# Patient Record
Sex: Male | Born: 1960 | Race: Black or African American | Hispanic: No | Marital: Married | Smoking: Former smoker
Health system: Southern US, Community
[De-identification: ages and names within clinical notes are randomized; demographics above are authoritative.]

## PROBLEM LIST (undated history)

## (undated) DIAGNOSIS — R42 Dizziness and giddiness: Secondary | ICD-10-CM

## (undated) DIAGNOSIS — E785 Hyperlipidemia, unspecified: Secondary | ICD-10-CM

## (undated) DIAGNOSIS — R079 Chest pain, unspecified: Secondary | ICD-10-CM

## (undated) HISTORY — DX: Dizziness and giddiness: R42

## (undated) HISTORY — DX: Chest pain, unspecified: R07.9

## (undated) HISTORY — DX: Hyperlipidemia, unspecified: E78.5

---

## 2001-06-11 ENCOUNTER — Ambulatory Visit (HOSPITAL_COMMUNITY): Admission: RE | Admit: 2001-06-11 | Discharge: 2001-06-11 | Payer: Self-pay | Admitting: Pediatrics

## 2001-06-11 ENCOUNTER — Encounter: Payer: Self-pay | Admitting: Pediatrics

## 2002-07-30 ENCOUNTER — Ambulatory Visit (HOSPITAL_COMMUNITY): Admission: RE | Admit: 2002-07-30 | Discharge: 2002-07-30 | Payer: Self-pay | Admitting: Pediatrics

## 2002-07-30 ENCOUNTER — Encounter: Payer: Self-pay | Admitting: Pediatrics

## 2003-08-12 ENCOUNTER — Encounter: Payer: Self-pay | Admitting: Pediatrics

## 2003-08-12 ENCOUNTER — Ambulatory Visit (HOSPITAL_COMMUNITY): Admission: RE | Admit: 2003-08-12 | Discharge: 2003-08-12 | Payer: Self-pay | Admitting: Pediatrics

## 2004-02-09 ENCOUNTER — Ambulatory Visit (HOSPITAL_COMMUNITY): Admission: RE | Admit: 2004-02-09 | Discharge: 2004-02-09 | Payer: Self-pay | Admitting: General Surgery

## 2019-09-23 ENCOUNTER — Emergency Department (HOSPITAL_COMMUNITY): Payer: Federal, State, Local not specified - PPO

## 2019-09-23 ENCOUNTER — Encounter (HOSPITAL_COMMUNITY): Payer: Self-pay | Admitting: Emergency Medicine

## 2019-09-23 ENCOUNTER — Other Ambulatory Visit: Payer: Self-pay

## 2019-09-23 ENCOUNTER — Emergency Department (HOSPITAL_COMMUNITY)
Admission: EM | Admit: 2019-09-23 | Discharge: 2019-09-23 | Disposition: A | Discharge status: Home / Self Care | Payer: Federal, State, Local not specified - PPO | Attending: Emergency Medicine | Admitting: Emergency Medicine

## 2019-09-23 DIAGNOSIS — R0789 Other chest pain: Secondary | ICD-10-CM

## 2019-09-23 DIAGNOSIS — U071 COVID-19: Secondary | ICD-10-CM | POA: Insufficient documentation

## 2019-09-23 DIAGNOSIS — R1011 Right upper quadrant pain: Secondary | ICD-10-CM | POA: Insufficient documentation

## 2019-09-23 DIAGNOSIS — Z87891 Personal history of nicotine dependence: Secondary | ICD-10-CM | POA: Diagnosis not present

## 2019-09-23 DIAGNOSIS — M791 Myalgia, unspecified site: Secondary | ICD-10-CM

## 2019-09-23 LAB — CBC
HCT: 45.6 % (ref 39.0–52.0)
Hemoglobin: 14.4 g/dL (ref 13.0–17.0)
MCH: 27.6 pg (ref 26.0–34.0)
MCHC: 31.6 g/dL (ref 30.0–36.0)
MCV: 87.4 fL (ref 80.0–100.0)
Platelets: 215 10*3/uL (ref 150–400)
RBC: 5.22 MIL/uL (ref 4.22–5.81)
RDW: 13.7 % (ref 11.5–15.5)
WBC: 2.5 10*3/uL — ABNORMAL LOW (ref 4.0–10.5)
nRBC: 0 % (ref 0.0–0.2)

## 2019-09-23 LAB — URINALYSIS, ROUTINE W REFLEX MICROSCOPIC
Bilirubin Urine: NEGATIVE
Glucose, UA: NEGATIVE mg/dL
Hgb urine dipstick: NEGATIVE
Ketones, ur: NEGATIVE mg/dL
Leukocytes,Ua: NEGATIVE
Nitrite: NEGATIVE
Protein, ur: NEGATIVE mg/dL
Specific Gravity, Urine: 1.021 (ref 1.005–1.030)
pH: 7 (ref 5.0–8.0)

## 2019-09-23 LAB — TROPONIN I (HIGH SENSITIVITY)
Troponin I (High Sensitivity): 5 ng/L (ref <18)
Troponin I (High Sensitivity): 6 ng/L (ref <18)

## 2019-09-23 LAB — COMPREHENSIVE METABOLIC PANEL
ALT: 39 U/L (ref 0–44)
AST: 25 U/L (ref 15–41)
Albumin: 4.2 g/dL (ref 3.5–5.0)
Alkaline Phosphatase: 67 U/L (ref 38–126)
Anion gap: 9 (ref 5–15)
BUN: 9 mg/dL (ref 6–20)
CO2: 24 mmol/L (ref 22–32)
Calcium: 8.9 mg/dL (ref 8.9–10.3)
Chloride: 105 mmol/L (ref 98–111)
Creatinine, Ser: 0.96 mg/dL (ref 0.61–1.24)
GFR calc Af Amer: >60 mL/min (ref >60)
GFR calc non Af Amer: >60 mL/min (ref >60)
Glucose, Bld: 99 mg/dL (ref 70–99)
Potassium: 4.5 mmol/L (ref 3.5–5.1)
Sodium: 138 mmol/L (ref 135–145)
Total Bilirubin: 0.5 mg/dL (ref 0.3–1.2)
Total Protein: 7.6 g/dL (ref 6.5–8.1)

## 2019-09-23 LAB — CBG MONITORING, ED: Glucose-Capillary: 102 mg/dL — ABNORMAL HIGH (ref 70–99)

## 2019-09-23 LAB — CK: Total CK: 206 U/L (ref 49–397)

## 2019-09-23 LAB — D-DIMER, QUANTITATIVE: D-Dimer, Quant: 0.37 ug/mL-FEU (ref 0.00–0.50)

## 2019-09-23 LAB — LIPASE, BLOOD: Lipase: 23 U/L (ref 11–51)

## 2019-09-23 MED ORDER — IOHEXOL 300 MG/ML  SOLN
100.0000 mL | Freq: Once | INTRAMUSCULAR | Status: AC | PRN
  Administered 2019-09-23: 100 mL via INTRAVENOUS

## 2019-09-23 MED ORDER — KETOROLAC TROMETHAMINE 30 MG/ML IJ SOLN
30.0000 mg | Freq: Once | INTRAMUSCULAR | Status: AC
  Administered 2019-09-23: 30 mg via INTRAVENOUS
  Filled 2019-09-23: qty 1

## 2019-09-23 MED ORDER — NAPROXEN 500 MG PO TABS: 500.0000 mg | ORAL_TABLET | Freq: Two times a day (BID) | ORAL | 0 refills | Status: AC

## 2019-09-23 MED ORDER — SODIUM CHLORIDE 0.9 % IV BOLUS
1000.0000 mL | Freq: Once | INTRAVENOUS | Status: AC
  Administered 2019-09-23: 1000 mL via INTRAVENOUS

## 2019-09-23 MED ORDER — SODIUM CHLORIDE 0.9% FLUSH: 3.0000 mL | Freq: Once | INTRAVENOUS | Status: DC

## 2019-09-23 NOTE — ED Triage Notes (Signed)
Patient reports RUQ pain that started this morning. Patient c/o head pain, muscle aches, and oliguria that started yesterday.

## 2019-09-23 NOTE — Discharge Instructions (Addendum)
Try alternating ice and heat to your right hip and thigh.  Take the medication as directed if needed for pain.  Your Covid test is pending.  Your results should be back in 24 to 48 hours.  You will need to self isolate at home at least until your test results are back.  Follow-up with your primary doctor or return to the ER for any worsening symptoms

## 2019-09-23 NOTE — ED Triage Notes (Signed)
CBG 102 

## 2019-09-23 NOTE — ED Provider Notes (Signed)
[facility] EMERGENCY DEPARTMENT Provider Note   CSN: [id number] Arrival date & time: 09/23/19  1249     History   Chief Complaint Chief Complaint  Patient presents with   Abdominal Pain    HPI Joshua Arias is a 58 y.o. male.     HPI  Joshua Arias is a 58 y.o. male who presents to the Emergency Department complaining of right sided chest pain and flank pain that began 2 days ago.  He describes the pain as sharp in stabbing in quality.  Pain is associated with movement such as deep breathing and right arm movement pain subsides when at rest.  3 days ago, the noted having muscle aches to his right groin and right anterior thigh, frequency of urination, and headache.  He does note drinking an increased amount of fluids recently.  He denies burning with urination or hematuria.  No history of kidney stones.  He also denies abdominal pain, vomiting, diarrhea and fever or chills.  No shortness of breath.  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 No pertinent past medical history.  There are no active problems to display for this patient.   History reviewed. No pertinent surgical history.    Home Medications    Prior to Admission medications   Not on File    Family History Family History  Problem Relation Age of Onset   Cancer Mother    Cancer Sister    Diabetes Other     Social History Social History   Tobacco Use   Smoking status: Former Smoker   Smokeless tobacco: Never Used  Substance Use Topics   Alcohol use: Never    Frequency: Never   Drug use: Never     Allergies   Penicillins   Review of Systems Review of Systems  Constitutional: Negative for chills and fever.  HENT: Negative for congestion, rhinorrhea, sore throat and trouble swallowing.   Respiratory: Negative for cough, chest tightness and shortness of breath.   Cardiovascular: Positive for chest pain (Right-sided rib and chest  pain).  Genitourinary: Positive for flank pain and frequency. Negative for decreased urine volume and difficulty urinating.  Musculoskeletal: Positive for myalgias. Negative for arthralgias and back pain.  Neurological: Positive for headaches. Negative for dizziness, seizures, syncope, speech difficulty and weakness.  Psychiatric/Behavioral: Negative for confusion.     Physical Exam Updated Vital Signs BP 115/79    Pulse 81    Temp 100 F (37.8 C) (Oral)    Resp 18    Ht 5\' 10"  (1.778 m)    Wt 102.1 kg    SpO2 96%    BMI 32.28 kg/m   Physical Exam Vitals signs and nursing note reviewed.  Constitutional:      Appearance: He is well-developed. He is not ill-appearing or toxic-appearing.  HENT:     Head: Atraumatic.     Mouth/Throat:     Mouth: Mucous membranes are moist.  Eyes:     Extraocular Movements: Extraocular movements intact.     Pupils: Pupils are equal, round, and reactive to light.  Neck:     Musculoskeletal: Normal range of motion. No muscular tenderness.     Meningeal: Kernig's sign absent.  Cardiovascular:     Rate and Rhythm: Normal rate and regular rhythm.     Pulses: Normal pulses.  Pulmonary:     Effort: Pulmonary effort is normal.     Breath sounds: Normal  breath sounds.  Chest:     Chest wall: Tenderness (Focal tenderness to palpation of the right lateral chest wall.  No crepitus or bony deformity.) present.  Abdominal:     General: There is no distension.     Palpations: Abdomen is soft. There is no mass.     Tenderness: There is no abdominal tenderness. There is no guarding.     Comments: No right-sided CVA tenderness.  Musculoskeletal: Normal range of motion.     Right lower leg: No edema.     Left lower leg: No edema.  Lymphadenopathy:     Cervical: No cervical adenopathy.  Skin:    General: Skin is warm.     Capillary Refill: Capillary refill takes less than 2 seconds.     Findings: No rash.  Neurological:     General: No focal deficit  present.     Mental Status: He is alert.     Sensory: No sensory deficit.     Motor: No weakness.      ED Treatments / Results  Labs (all labs ordered are listed, but only abnormal results are displayed) Labs Reviewed  CBC - Abnormal; Notable for the following components:      Result Value   WBC 2.5 (*)    All other components within normal limits  URINALYSIS, ROUTINE W REFLEX MICROSCOPIC - Abnormal; Notable for the following components:   APPearance HAZY (*)    All other components within normal limits  CBG MONITORING, ED - Abnormal; Notable for the following components:   Glucose-Capillary 102 (*)    All other components within normal limits  SARS CORONAVIRUS 2 (TAT 6-24 HRS)  LIPASE, BLOOD  COMPREHENSIVE METABOLIC PANEL  D-DIMER, QUANTITATIVE (NOT AT [facility])  CK  TROPONIN I (HIGH SENSITIVITY)  TROPONIN I (HIGH SENSITIVITY)    EKG EKG Interpretation  Date/Time:  Wednesday September 23 2019 17:13:29 EST Ventricular Rate:  82 PR Interval:    QRS Duration: 89 QT Interval:  355 QTC Calculation: 415 R Axis:   9 Text Interpretation: Sinus rhythm No previous ECGs available Confirmed by Glynn Octaveancour, Stephen [phone]([id number]) on 09/23/2019 5:34:45 PM   Radiology Ct Abdomen Pelvis W Contrast  Result Date: 09/23/2019 CLINICAL DATA:  Right upper quadrant pain began this morning EXAM: CT ABDOMEN AND PELVIS WITH CONTRAST TECHNIQUE: Multidetector CT imaging of the abdomen and pelvis was performed using the standard protocol following bolus administration of intravenous contrast. CONTRAST:  100mL OMNIPAQUE IOHEXOL 300 MG/ML  SOLN COMPARISON:  Same day chest radiograph FINDINGS: Lower chest: Basilar atelectatic changes. No consolidation or effusion. Normal heart size. No pericardial effusion. Coronary artery atherosclerosis is present. Hepatobiliary: No focal liver abnormality is seen. No gallstones, gallbladder wall thickening, or biliary dilatation. Pancreas: Unremarkable. No pancreatic ductal  dilatation or surrounding inflammatory changes. Spleen: Normal in size without focal abnormality. Adrenals/Urinary Tract: Adrenal glands are unremarkable. Kidneys are normal, without renal calculi, focal lesion, or hydronephrosis. No extravasation of contrast is seen on excretory phase delayed imaging. Bladder is unremarkable. Stomach/Bowel: Distal esophagus, stomach and duodenal sweep are unremarkable. No small bowel wall thickening or dilatation. No evidence of obstruction. A normal appendix is visualized. No colonic dilatation or wall thickening. Scattered colonic diverticula without focal pericolonic inflammation to suggest diverticulitis. Vascular/Lymphatic: The aorta is normal caliber. No suspicious or enlarged lymph nodes in the included lymphatic chains. Reproductive: The prostate and seminal vesicles are unremarkable. Included portions of the external genitalia are free of acute abnormality. Other: No abdominopelvic free fluid or free  gas. No bowel containing hernias. Small fat containing umbilical hernia. Musculoskeletal: Multilevel degenerative changes are present in the imaged portions of the spine. Several Schmorl's node formations are noted. Mild arthrosis at the symphysis pubis. No acute osseous abnormality or suspicious osseous lesion. IMPRESSION: No acute intra-abdominal process. No explanation for the patient's symptoms. Diverticulosis without evidence of acute diverticulitis. Electronically Signed   By: Lovena Le M.D.   On: 09/23/2019 20:48   Dg Chest Portable 1 View  Result Date: 09/23/2019 CLINICAL DATA:  Right-sided chest pain and right upper quadrant abdominal pain. EXAM: PORTABLE CHEST 1 VIEW COMPARISON:  None. FINDINGS: The heart size and mediastinal contours are within normal limits. Both lungs are clear. The visualized skeletal structures are unremarkable. IMPRESSION: Normal exam. Electronically Signed   By: Lorriane Shire M.D.   On: 09/23/2019 18:01    Procedures Procedures  (including critical care time)  Medications Ordered in ED Medications  sodium chloride flush (NS) 0.9 % injection 3 mL (3 mLs Intravenous Not Given 09/23/19 1821)  sodium chloride 0.9 % bolus 1,000 mL (1,000 mLs Intravenous New Bag/Given 09/23/19 1714)     Initial Impression / Assessment and Plan / ED Course  I have reviewed the triage vital signs and the nursing notes.  Pertinent labs & imaging results that were available during my care of the patient were reviewed by me and considered in my medical decision making (see chart for details).        Patient with right sided chest wall pain, myalgias and low-grade fever.  Symptoms present for several days.  On exam he has no abdominal tenderness.  Will obtain labs, chest x-ray, and EKG.  Clinical suspicion for ACS or PE is low.  Covid test is pending.  On recheck, patient is resting comfortably.  Vital signs reviewed.  No tachycardia or tachypnea.  D-dimer is reassuring.  Chest x-ray is negative for infiltrates.  Mild leukopenia, origin unclear.  We will also obtain CT abdomen pelvis.  Patient offered antiemetic and pain medication but he declined.   Patient also seen by Dr. Wyvonnia Dusky and care plan discussed.  Patient symptoms are felt to be viral versus possible lumbar radiculopathy.  Doubt emergent process.  Patient is ambulatory and gait is steady.  He appears appropriate for discharge home, will follow up closely with PCP.  Strict return precautions were discussed.   Final Clinical Impressions(s) / ED Diagnoses   Final diagnoses:  Right-sided chest wall pain  Myalgia    ED Discharge Orders    None       Bufford Lope 09/23/19 2242    Ezequiel Essex, MD 09/24/19 0131

## 2019-09-24 LAB — SARS CORONAVIRUS 2 (TAT 6-24 HRS): SARS Coronavirus 2: POSITIVE — AB

## 2019-09-24 NOTE — ED Notes (Signed)
CRITICAL VALUE ALERT  Critical Value:  Covid + Date & Time Notied:  09/24/2019 @ 0025 Provider Notified: Cyndi Bender, PA_C Orders Received/Actions taken: None-pt discharged to home on self-quarentine

## 2020-04-27 IMAGING — CT CT ABD-PELV W/ CM
2 of 5 series · 14 of 46 positions shown, 16 images · IV contrast (Omnipaque or Isovue)
Comparison: Same day chest radiograph

CLINICAL DATA: Right upper quadrant pain began this morning

EXAM:
CT ABDOMEN AND PELVIS WITH CONTRAST
TECHNIQUE: Multidetector CT imaging of the abdomen and pelvis was performed
using the standard protocol following bolus administration of
intravenous contrast.
CONTRAST:  100mL OMNIPAQUE IOHEXOL 300 MG/ML  SOLN

[Series 2: axial st · axial · 0.75mm/px · z∈[-823,-313]mm · 11 of 118 slices shown, 13 images]
[im 8/118  soft-tissue]
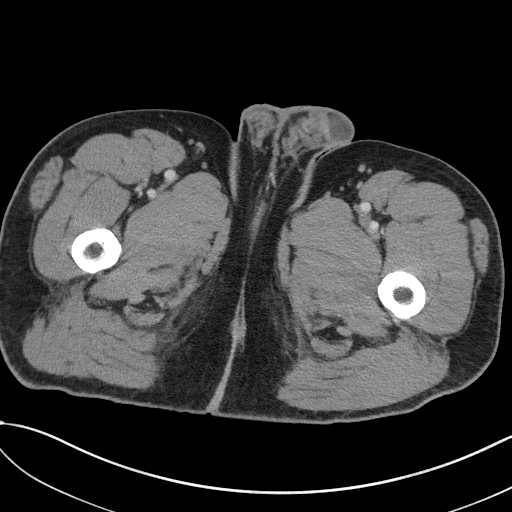
[im 8/118  bone]
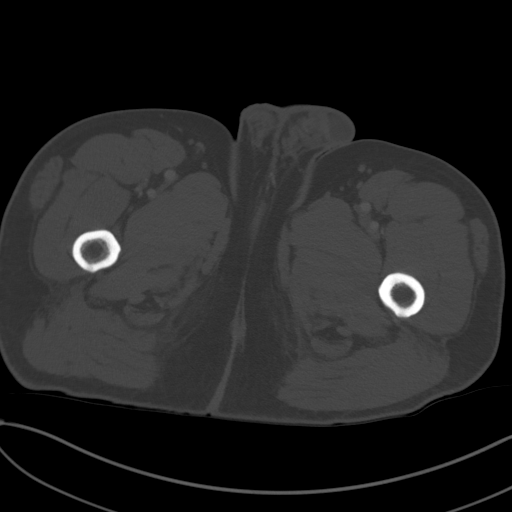
[im 22/118  soft-tissue]
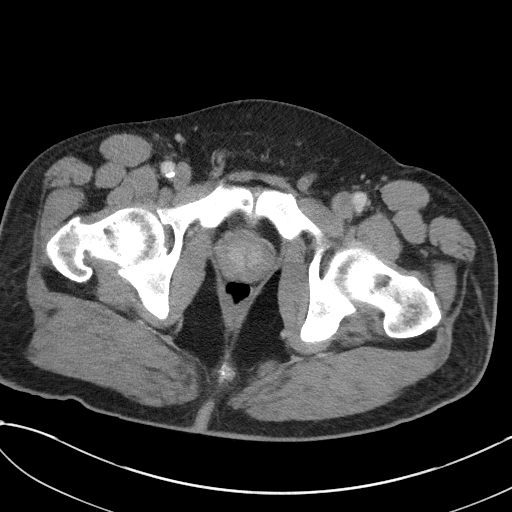
[im 30/118  soft-tissue]
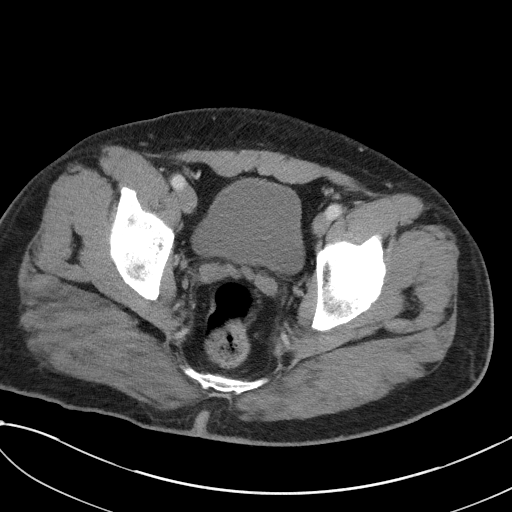
[im 37/118  soft-tissue]
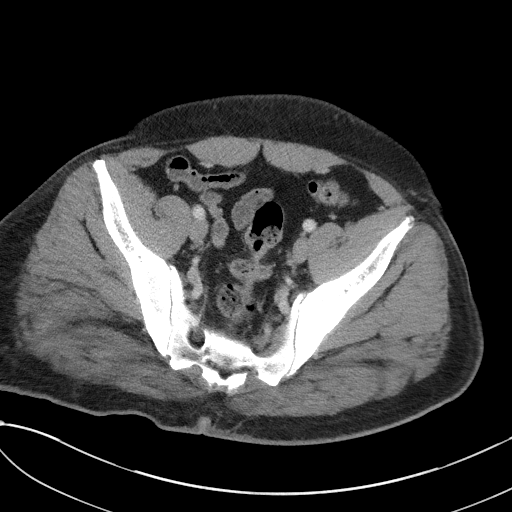
[im 52/118  soft-tissue]
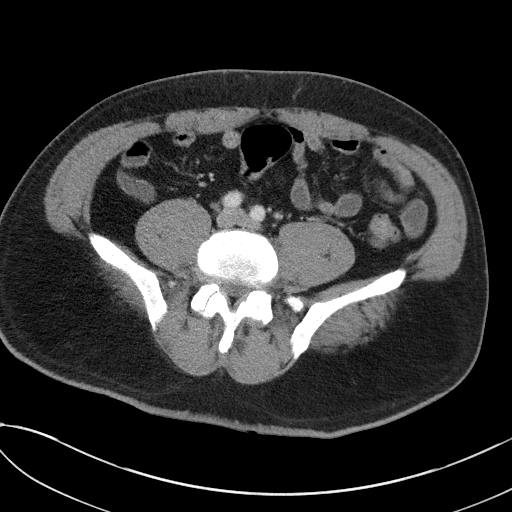
[im 59/118  soft-tissue]
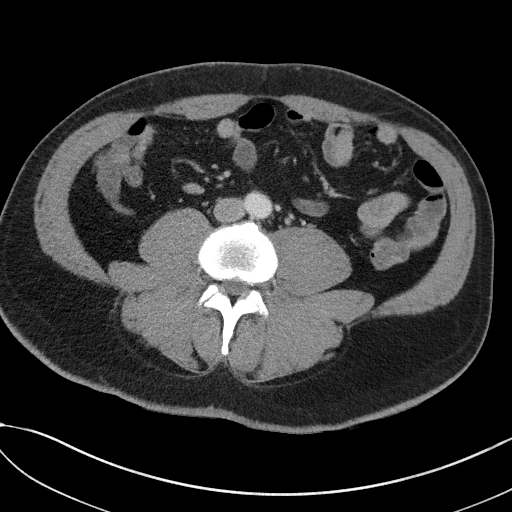
[im 66/118  soft-tissue]
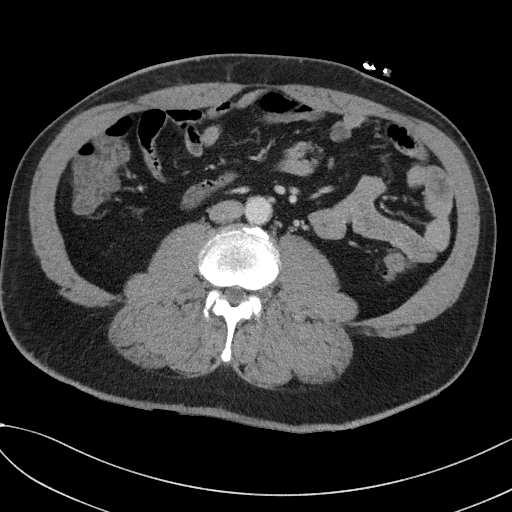
[im 81/118  soft-tissue]
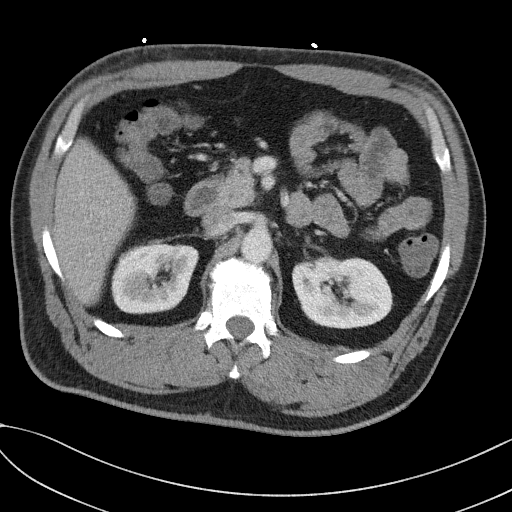
[im 88/118  soft-tissue]
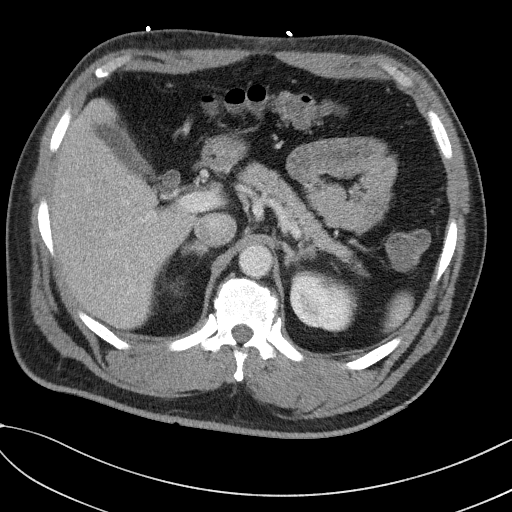
[im 88/118  bone]
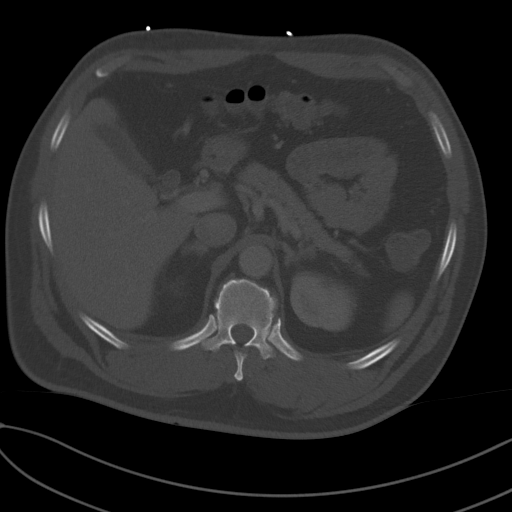
[im 96/118  soft-tissue]
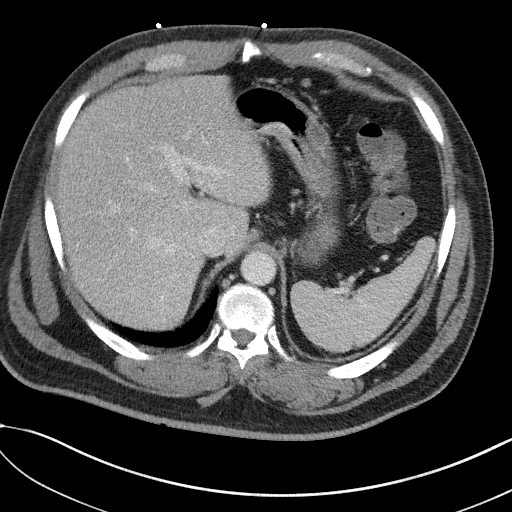
[im 110/118  soft-tissue]
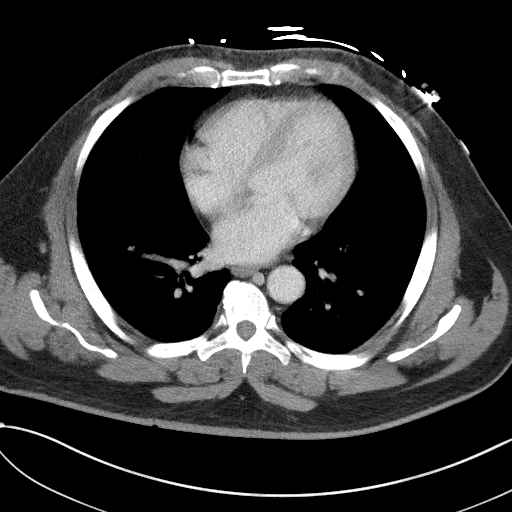

[Series 5: coronal st · coronal · 0.70mm/px · 3 of 99 slices shown]
[im 33/99  soft-tissue]
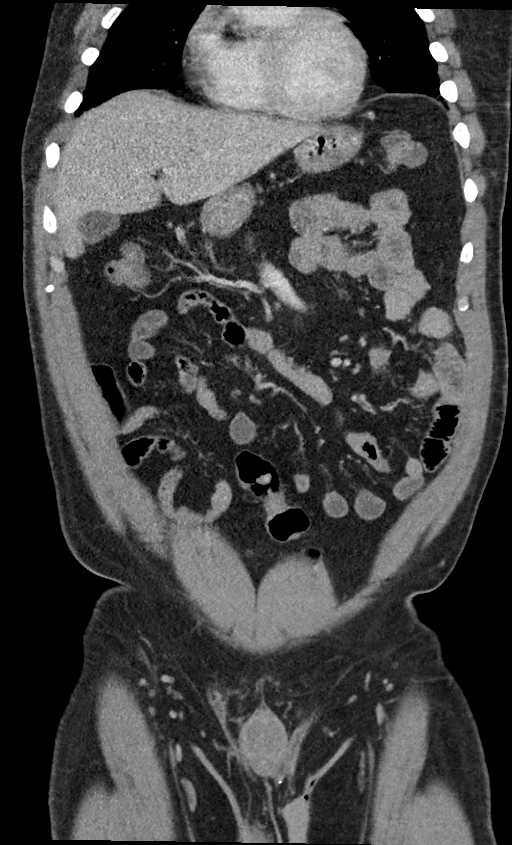
[im 44/99  soft-tissue]
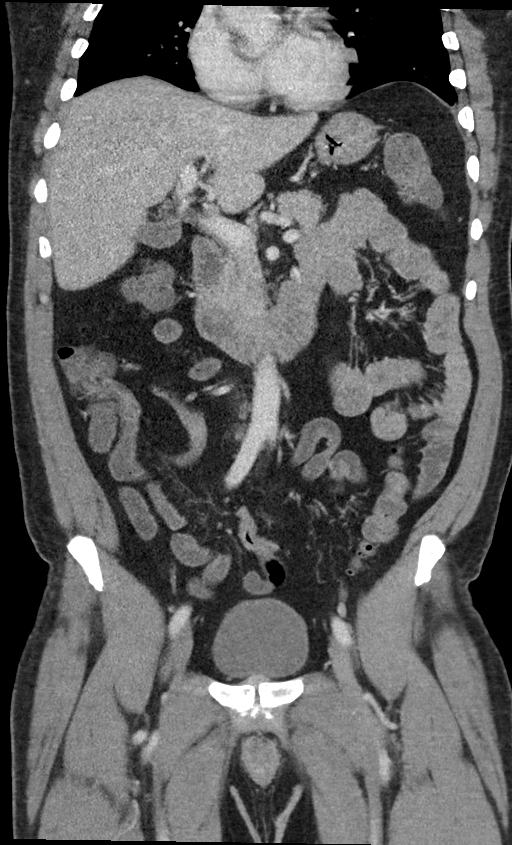
[im 55/99  soft-tissue]
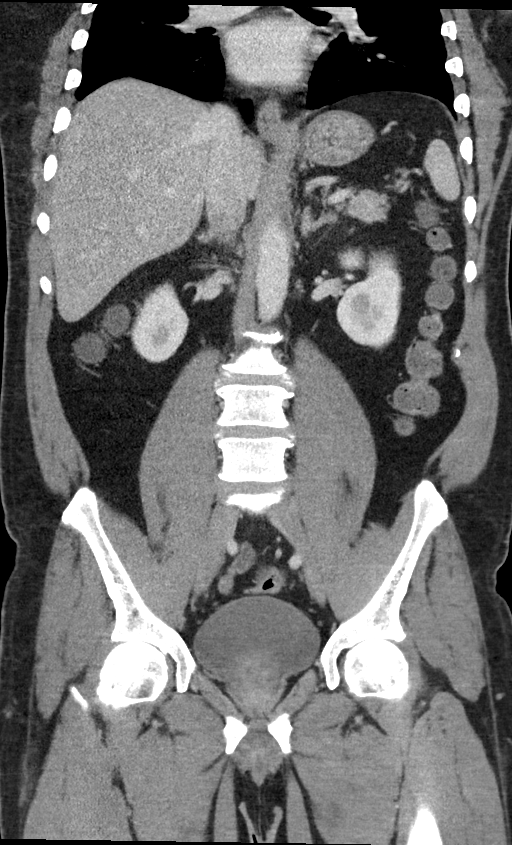

[14 of 46 positions shown; findings below may reference images not displayed]

FINDINGS: Lower chest: Basilar atelectatic changes. No consolidation or
effusion. Normal heart size. No pericardial effusion. Coronary
artery atherosclerosis is present.

Hepatobiliary: No focal liver abnormality is seen. No gallstones,
gallbladder wall thickening, or biliary dilatation.

Pancreas: Unremarkable. No pancreatic ductal dilatation or
surrounding inflammatory changes.

Spleen: Normal in size without focal abnormality.

Adrenals/Urinary Tract: Adrenal glands are unremarkable. Kidneys are
normal, without renal calculi, focal lesion, or hydronephrosis. No
extravasation of contrast is seen on excretory phase delayed
imaging. Bladder is unremarkable.

Stomach/Bowel: Distal esophagus, stomach and duodenal sweep are
unremarkable. No small bowel wall thickening or dilatation. No
evidence of obstruction. A normal appendix is visualized. No colonic
dilatation or wall thickening. Scattered colonic diverticula without
focal pericolonic inflammation to suggest diverticulitis.

Vascular/Lymphatic: The aorta is normal caliber. No suspicious or
enlarged lymph nodes in the included lymphatic chains.

Reproductive: The prostate and seminal vesicles are unremarkable.
Included portions of the external genitalia are free of acute
abnormality.

Other: No abdominopelvic free fluid or free gas. No bowel containing
hernias. Small fat containing umbilical hernia.

Musculoskeletal: Multilevel degenerative changes are present in the
imaged portions of the spine. Several Schmorl's node formations are
noted. Mild arthrosis at the symphysis pubis. No acute osseous
abnormality or suspicious osseous lesion.
IMPRESSION: No acute intra-abdominal process. No explanation for the patient's
symptoms.

Diverticulosis without evidence of acute diverticulitis.

## 2023-11-29 ENCOUNTER — Encounter: Payer: Self-pay | Admitting: *Deleted

## 2023-11-29 ENCOUNTER — Ambulatory Visit: Payer: Federal, State, Local not specified - PPO | Attending: Cardiology | Admitting: Cardiology

## 2023-11-29 ENCOUNTER — Encounter: Payer: Self-pay | Admitting: Cardiology

## 2023-11-29 VITALS — BP 118/76 | HR 75 | Ht 70.0 in | Wt 236.2 lb

## 2023-11-29 DIAGNOSIS — R079 Chest pain, unspecified: Secondary | ICD-10-CM

## 2023-11-29 DIAGNOSIS — R55 Syncope and collapse: Secondary | ICD-10-CM

## 2023-11-29 NOTE — Progress Notes (Signed)
Clinical Summary Joshua Arias is a 63 y.o.male seen today as a new patient for the following medical problems.   Previously seen by [facility]   1.Chest pain - prior evaluation 03/2022 at [facility] - 03/2022 echo [facility]: LVEF 60-65%, mild AI - 03/2022 [facility]: no ischemia, exercised 6 min 30 sec   - ongoing 4-5 years - dull pain left sided, 4-7 in severity. Can occur at rest or with activity. No other associated symptoms. Not positional. Can last for days constant, or up to 20 minutes. - can have some palpitatons Nov to December, could last all day long. Symptoms have resolved. Cut back on coffee for about the last year.      2. 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 Weights about 75 lbs - set it on the floor, was bending over and got dizzy. Raised up and walkd from out under the car, layed on the side of the lift, felt like room was spinning. Tried to take a step and off balance, was able to make it to his desk. Sat down, didn't feel any better. Got very nauseous, dry heaving.  -tried to stand up again, arms felt numb. Felt weak all over, went down on floor.  - started feeling SOB, clammy skin, sweaty. EMS was called - worked in ER, then discharge - mixed on his oral hydration, some days better than others  Seen ER in [place], awaiting records     Past Medical History:  Diagnosis Date   Hyperlipidemia    Nonspecific chest pain    Postural dizziness with presyncope      Allergies  Allergen Reactions   Penicillins Anaphylaxis   Rosuvastatin Anaphylaxis    Lock jaw   Atorvastatin Other (See Comments)    cramp     Current Outpatient Medications  Medication Sig Dispense Refill   diclofenac Sodium (VOLTAREN) 1 % GEL Apply topically.     naproxen (NAPROSYN) 500 MG tablet Take 1 tablet (500 mg total) by mouth 2 (two) times daily with a meal. 20 tablet 0   Pseudoeph-Doxylamine-DM-APAP  (NYQUIL D COLD/FLU PO) Take 10-15 mLs by mouth at bedtime as needed (for severe cold).     rosuvastatin (CRESTOR) 10 MG tablet Take 1 tablet by mouth at bedtime.     No current facility-administered medications for this visit.     No past surgical history on file.   Allergies  Allergen Reactions   Penicillins Anaphylaxis   Rosuvastatin Anaphylaxis    Lock jaw   Atorvastatin Other (See Comments)    cramp      Family History  Problem Relation Age of Onset   Cancer Mother    Diabetes Father    Arthritis Father    Asthma Father    Cancer Sister    Diabetes Brother    Colon cancer Brother    Diabetes Other      Social History Mr. Loman reports that he has quit smoking. He has never used smokeless tobacco. Mr. Vanderveen reports no history of alcohol use.   Review of Systems CONSTITUTIONAL: No weight loss, fever, chills, weakness or fatigue.  HEENT: Eyes: No visual loss, blurred vision, double vision or yellow sclerae.No hearing loss, sneezing, congestion, runny nose or sore throat.  SKIN: No rash or itching.  CARDIOVASCULAR: per hpi RESPIRATORY: No shortness of breath, cough or sputum.  GASTROINTESTINAL: No anorexia,  nausea, vomiting or diarrhea. No abdominal pain or blood.  GENITOURINARY: No burning on urination, no polyuria NEUROLOGICAL: No headache, dizziness, syncope, paralysis, ataxia, numbness or tingling in the extremities. No change in bowel or bladder control.  MUSCULOSKELETAL: No muscle, back pain, joint pain or stiffness.  LYMPHATICS: No enlarged nodes. No history of splenectomy.  PSYCHIATRIC: No history of depression or anxiety.  ENDOCRINOLOGIC: No reports of sweating, cold or heat intolerance. No polyuria or polydipsia.  Marland Kitchen   Physical Examination Today's Vitals   11/29/23 1537  BP: 118/76  Pulse: 75  SpO2: 97%  Weight: 236 lb 3.2 oz (107.1 kg)  Height: 5\' 10"  (1.778 m)   Body mass index is 33.89 kg/m.  Gen: resting comfortably, no acute  distress HEENT: no scleral icterus, pupils equal round and reactive, no palptable cervical adenopathy,  CV: RRR, no m/rg, no jvd Resp: Clear to auscultation bilaterally GI: abdomen is soft, non-tender, non-distended, normal bowel sounds, no hepatosplenomegaly MSK: extremities are warm, no edema.  Skin: warm, no rash Neuro:  no focal deficits Psych: appropriate affect     Assessment and Plan  Chest pain - long history, prior cardiac testing was benign - recent symptoms not cardiac desciription , can last up to 1 day constant - if recurrence would consider trial of antacid  2. Presyncope - history would suggest likely vasovagal episode, perhaps component of orthostasis as well - f/u records from [facility] - if recurrence depending on history could consider cardiac monitor - encouraged aggressive hydration  3. Palpitations - resolved, if recurrence could consider cardiac monitor   F/u 3 months     Antoine Poche, M.D.

## 2023-11-29 NOTE — Patient Instructions (Signed)
Medication Instructions:  Continue all current medications.  Labwork: none  Testing/Procedures: none  Follow-Up: 3 months   Any Other Special Instructions Will Be Listed Below (If Applicable).  If you need a refill on your cardiac medications before your next appointment, please call your pharmacy.

## 2024-02-27 ENCOUNTER — Ambulatory Visit: Payer: Federal, State, Local not specified - PPO | Admitting: Nurse Practitioner

## 2024-04-24 ENCOUNTER — Ambulatory Visit: Attending: Nurse Practitioner | Admitting: Nurse Practitioner

## 2024-04-24 NOTE — Progress Notes (Deleted)
  Cardiology Office Note   Date:  04/24/2024  ID:  Joshua Arias, DOB 01-Apr-1961, MRN [id number] PCP: Patient, No Pcp Per  [facility] Providers Cardiologist:  None { Click to update primary MD,subspecialty MD or APP then REFRESH:1}    History of Present Illness Joshua Arias is a 63 y.o. male ***  ROS: ***  Studies Reviewed      *** Risk Assessment/Calculations {Does this patient have ATRIAL FIBRILLATION?:[id number]} No BP recorded.  {Refresh Note OR Click here to enter BP  :1}***       Physical Exam VS:  There were no vitals taken for this visit.       Wt Readings from Last 3 Encounters:  11/29/23 236 lb 3.2 oz (107.1 kg)  09/23/19 225 lb (102.1 kg)    GEN: Well nourished, well developed in no acute distress NECK: No JVD; No carotid bruits CARDIAC: ***RRR, no murmurs, rubs, gallops RESPIRATORY:  Clear to auscultation without rales, wheezing or rhonchi  ABDOMEN: Soft, non-tender, non-distended EXTREMITIES:  No edema; No deformity   ASSESSMENT AND PLAN ***    {Are you ordering a CV Procedure (e.g. stress test, cath, DCCV, TEE, etc)?   Press F2        :[id number]}  Dispo: ***  Signed, Lasalle Pointer, NP
